# Patient Record
Sex: Male | Born: 1995 | Race: White | Hispanic: No | Marital: Single | Smoking: Current every day smoker
Health system: Southern US, Community
[De-identification: ages and names within clinical notes are randomized; demographics above are authoritative.]

## PROBLEM LIST (undated history)

## (undated) DIAGNOSIS — J45909 Unspecified asthma, uncomplicated: Secondary | ICD-10-CM

## (undated) DIAGNOSIS — A6920 Lyme disease, unspecified: Secondary | ICD-10-CM

## (undated) HISTORY — PX: ELBOW SURGERY: SHX618

---

## 2016-05-04 ENCOUNTER — Encounter (HOSPITAL_COMMUNITY): Payer: Self-pay

## 2016-05-04 ENCOUNTER — Emergency Department (HOSPITAL_COMMUNITY)
Admission: EM | Admit: 2016-05-04 | Discharge: 2016-05-04 | Disposition: A | Discharge status: Home / Self Care | Payer: PRIVATE HEALTH INSURANCE | Attending: Emergency Medicine | Admitting: Emergency Medicine

## 2016-05-04 ENCOUNTER — Emergency Department (HOSPITAL_COMMUNITY): Payer: PRIVATE HEALTH INSURANCE

## 2016-05-04 DIAGNOSIS — S61411A Laceration without foreign body of right hand, initial encounter: Secondary | ICD-10-CM | POA: Insufficient documentation

## 2016-05-04 DIAGNOSIS — W25XXXA Contact with sharp glass, initial encounter: Secondary | ICD-10-CM | POA: Diagnosis not present

## 2016-05-04 DIAGNOSIS — Y999 Unspecified external cause status: Secondary | ICD-10-CM | POA: Insufficient documentation

## 2016-05-04 DIAGNOSIS — Z79899 Other long term (current) drug therapy: Secondary | ICD-10-CM | POA: Insufficient documentation

## 2016-05-04 DIAGNOSIS — Y939 Activity, unspecified: Secondary | ICD-10-CM | POA: Diagnosis not present

## 2016-05-04 DIAGNOSIS — S61211A Laceration without foreign body of left index finger without damage to nail, initial encounter: Secondary | ICD-10-CM | POA: Insufficient documentation

## 2016-05-04 DIAGNOSIS — Y929 Unspecified place or not applicable: Secondary | ICD-10-CM | POA: Diagnosis not present

## 2016-05-04 DIAGNOSIS — J45909 Unspecified asthma, uncomplicated: Secondary | ICD-10-CM | POA: Diagnosis not present

## 2016-05-04 DIAGNOSIS — F17210 Nicotine dependence, cigarettes, uncomplicated: Secondary | ICD-10-CM | POA: Diagnosis not present

## 2016-05-04 HISTORY — DX: Unspecified asthma, uncomplicated: J45.909

## 2016-05-04 HISTORY — DX: Lyme disease, unspecified: A69.20

## 2016-05-04 MED ORDER — LIDOCAINE HCL 2 % IJ SOLN
10.0000 mL | Freq: Once | INTRAMUSCULAR | Status: AC
  Administered 2016-05-04: 200 mg via INTRADERMAL
  Filled 2016-05-04: qty 20

## 2016-05-04 NOTE — ED Notes (Signed)
Bed: WTR5 Expected date:  Expected time:  Means of arrival:  Comments: 

## 2016-05-04 NOTE — ED Notes (Signed)
PA at bedside.

## 2016-05-04 NOTE — ED Provider Notes (Signed)
[facility]-EMERGENCY DEPT Provider Note   CSN: [id number] Arrival date & time: 05/04/16  [id number]     History   Chief Complaint Chief Complaint  Patient presents with  . Hand Lacerations    HPI Micheal Humphrey is a 21 y.o. male.  HPI Micheal Humphrey is a 21 y.o. male presents to emergency department complaining bilateral hand injury. Patient states that he punched through a glass window and sustained lacerations to both hands. He reports bleeding that was stopped with pressure. Reports some numbness distal to laceration on the left hand, denies any weakness or difficulty moving his fingers. Tetanus is up-to-date. Denies any other injuries. Admits to marijuana and alcohol last night.   Past Medical History:  Diagnosis Date  . Asthma   . Lyme disease     There are no active problems to display for this patient.   Past Surgical History:  Procedure Laterality Date  . ELBOW SURGERY         Home Medications    Prior to Admission medications   Medication Sig Start Date End Date Taking? Authorizing Provider  acetaminophen (TYLENOL) 500 MG tablet Take 1,000 mg by mouth every 6 (six) hours as needed for mild pain, moderate pain, fever or headache.   Yes Historical Provider, MD  albuterol (PROVENTIL HFA;VENTOLIN HFA) 108 (90 Base) MCG/ACT inhaler Inhale 1-2 puffs into the lungs every 6 (six) hours as needed for wheezing or shortness of breath.   Yes Historical Provider, MD  levocetirizine (XYZAL) 5 MG tablet Take 5 mg by mouth daily.   Yes Historical Provider, MD  montelukast (SINGULAIR) 10 MG tablet Take 10 mg by mouth daily.   Yes Historical Provider, MD    Family History History reviewed. No pertinent family history.  Social History Social History  Substance Use Topics  . Smoking status: Current Every Day Smoker    Types: Cigarettes, E-cigarettes  . Smokeless tobacco: Never Used  . Alcohol use Yes     Allergies   Amoxicillin; Biaxin [clarithromycin]; Ceftin [cefuroxime];  Penicillins; Septra [sulfamethoxazole-trimethoprim]; and Doxycycline   Review of Systems Review of Systems  Constitutional: Negative for chills and fever.  Respiratory: Negative for cough, chest tightness and shortness of breath.   Cardiovascular: Negative for chest pain, palpitations and leg swelling.  Musculoskeletal: Positive for arthralgias. Negative for myalgias, neck pain and neck stiffness.  Skin: Positive for wound. Negative for rash.  Allergic/Immunologic: Negative for immunocompromised state.  Neurological: Negative for dizziness, weakness, light-headedness, numbness and headaches.  All other systems reviewed and are negative.    Physical Exam Updated Vital Signs BP 120/78 (BP Location: Right Arm)   Temp 98 F (36.7 C)   Resp 16   Ht 5\' 10"  (1.778 m)   Wt 68 kg   SpO2 98%   BMI 21.52 kg/m   Physical Exam  Constitutional: He appears well-developed and well-nourished. No distress.  Eyes: Conjunctivae are normal.  Neck: Neck supple.  Cardiovascular: Normal rate.   Pulmonary/Chest: No respiratory distress.  Abdominal: He exhibits no distension.  Musculoskeletal:  3 cm laceration to the dorsal aspect of the right hand, over second MCP knuckle. Full range of motion of the index finger, good strength with finger extension and flexion at each joint. Sensation intact distally. 1 cm flap laceration over the PIP joint of the left index finger on the dorsal surface. Full range of motion of the finger at PIP, DIP, MCP joints. Strength intact with flexion and extension at each joint. Decreased sensation to palpation over  dorsal distal left index finger. Cap refill <2 sec distally  Skin: Skin is warm and dry.  Nursing note and vitals reviewed.    ED Treatments / Results  Labs (all labs ordered are listed, but only abnormal results are displayed) Labs Reviewed - No data to display  EKG  EKG Interpretation None       Radiology No results  found.  Procedures Procedures (including critical care time) LACERATION REPAIR Performed by: Farris Has Markusic PA-S Authorized by: Jaynie Crumble A Consent: Verbal consent obtained. Risks and benefits: risks, benefits and alternatives were discussed Consent given by: patient Patient identity confirmed: provided demographic data Prepped and Draped in normal sterile fashion Wound explored  Laceration Location: righthand  Laceration Length: 3cm  No Foreign Bodies seen or palpated  Anesthesia: local infiltration  Local anesthetic: lidocaine 2% wo epinephrine  Anesthetic total: 2 ml  Irrigation method: syringe Amount of cleaning: standard  Skin closure: prolene 4.0  Number of sutures: 3  Technique: simple interrupted  Patient tolerance: Patient tolerated the procedure well with no immediate complications.  LACERATION REPAIR Performed by: Farris Has Markusic PA-S Authorized by: Jaynie Crumble A Consent: Verbal consent obtained. Risks and benefits: risks, benefits and alternatives were discussed Consent given by: patient Patient identity confirmed: provided demographic data Prepped and Draped in normal sterile fashion Wound explored  Laceration Location: left index finger  Laceration Length: 1cm  No Foreign Bodies seen or palpated  Anesthesia: local infiltration  Local anesthetic: lidocaine 2% wo epinephrine  Anesthetic total: 1 ml  Irrigation method: syringe Amount of cleaning: standard  Skin closure: prolene 4.0  Number of sutures: 2  Technique: simple interrupted  Patient tolerance: Patient tolerated the procedure well with no immediate complications.   Medications Ordered in ED Medications  lidocaine (XYLOCAINE) 2 % (with pres) injection 200 mg (200 mg Intradermal Given by Other 05/04/16 1042)     Initial Impression / Assessment and Plan / ED Course  I have reviewed the triage vital signs  and the nursing notes.  Pertinent labs & imaging results that were available during my care of the patient were reviewed by me and considered in my medical decision making (see chart for details).     Pt in ED with with lacerations to bilateral hands. Repaired with sutures. No foreign body palpated or seen on xray. No evidence of tendon injury. There may be some nerve involvement to the left index finger but pt having normal function and strength to the finger. Plan to dc home with wound care, suture removal in 7-10 days, follow up as needed.   Vitals:   05/04/16 0850 05/04/16 0858  BP: 120/78   Resp: 16   Temp: 98 F (36.7 C)   SpO2: 98%   Weight:  68 kg  Height:  5\' 10"  (1.778 m)     Final Clinical Impressions(s) / ED Diagnoses   Final diagnoses:  Laceration of right hand without foreign body, initial encounter  Laceration of left index finger without foreign body without damage to nail, initial encounter    New Prescriptions Discharge Medication List as of 05/04/2016 12:11 PM       Jaynie Crumble, PA-C 05/04/16 1515    Gerhard Munch, MD 05/04/16 [phone]

## 2016-05-04 NOTE — ED Notes (Signed)
Pt continues to talk on his phone.

## 2016-05-04 NOTE — ED Notes (Signed)
PT DISCHARGED. INSTRUCTIONS GIVEN TO [employer]. AAOX4. PT IN NO APPARENT DISTRESS. THE OPPORTUNITY TO ASK QUESTIONS WAS PROVIDED.

## 2016-05-04 NOTE — ED Notes (Signed)
When this RN went to additionally clean hands, Pt was more concerned with using his phone.  Will attempt again shortly.

## 2016-05-04 NOTE — ED Notes (Signed)
This Clinical research associatewriter assessed for obvious retained pieces of glass.  None were noted.  Pt asked to soak hands in provided basins containing sterile water.

## 2016-05-04 NOTE — Discharge Instructions (Signed)
Bacitracin 2-3 times a day. Keep laceration clean and dry. Follow up with your family doctor for suture removal in 7-10 days. Follow up sooner if any signs of infection.

## 2016-05-04 NOTE — ED Triage Notes (Signed)
Pt presents w/ [facility] after breaking out a window w/ his hands.  Multiple lacerations noted to both hands.  Pain score 10/10.

## 2017-08-02 IMAGING — CR DG FINGER INDEX 2+V*L*
3 series · 3 of 3 positions shown · non-contrast
Comparison: None.

CLINICAL DATA: Laceration through glass window

EXAM:
LEFT SECOND FINGER 2+V

[x finger pa left]
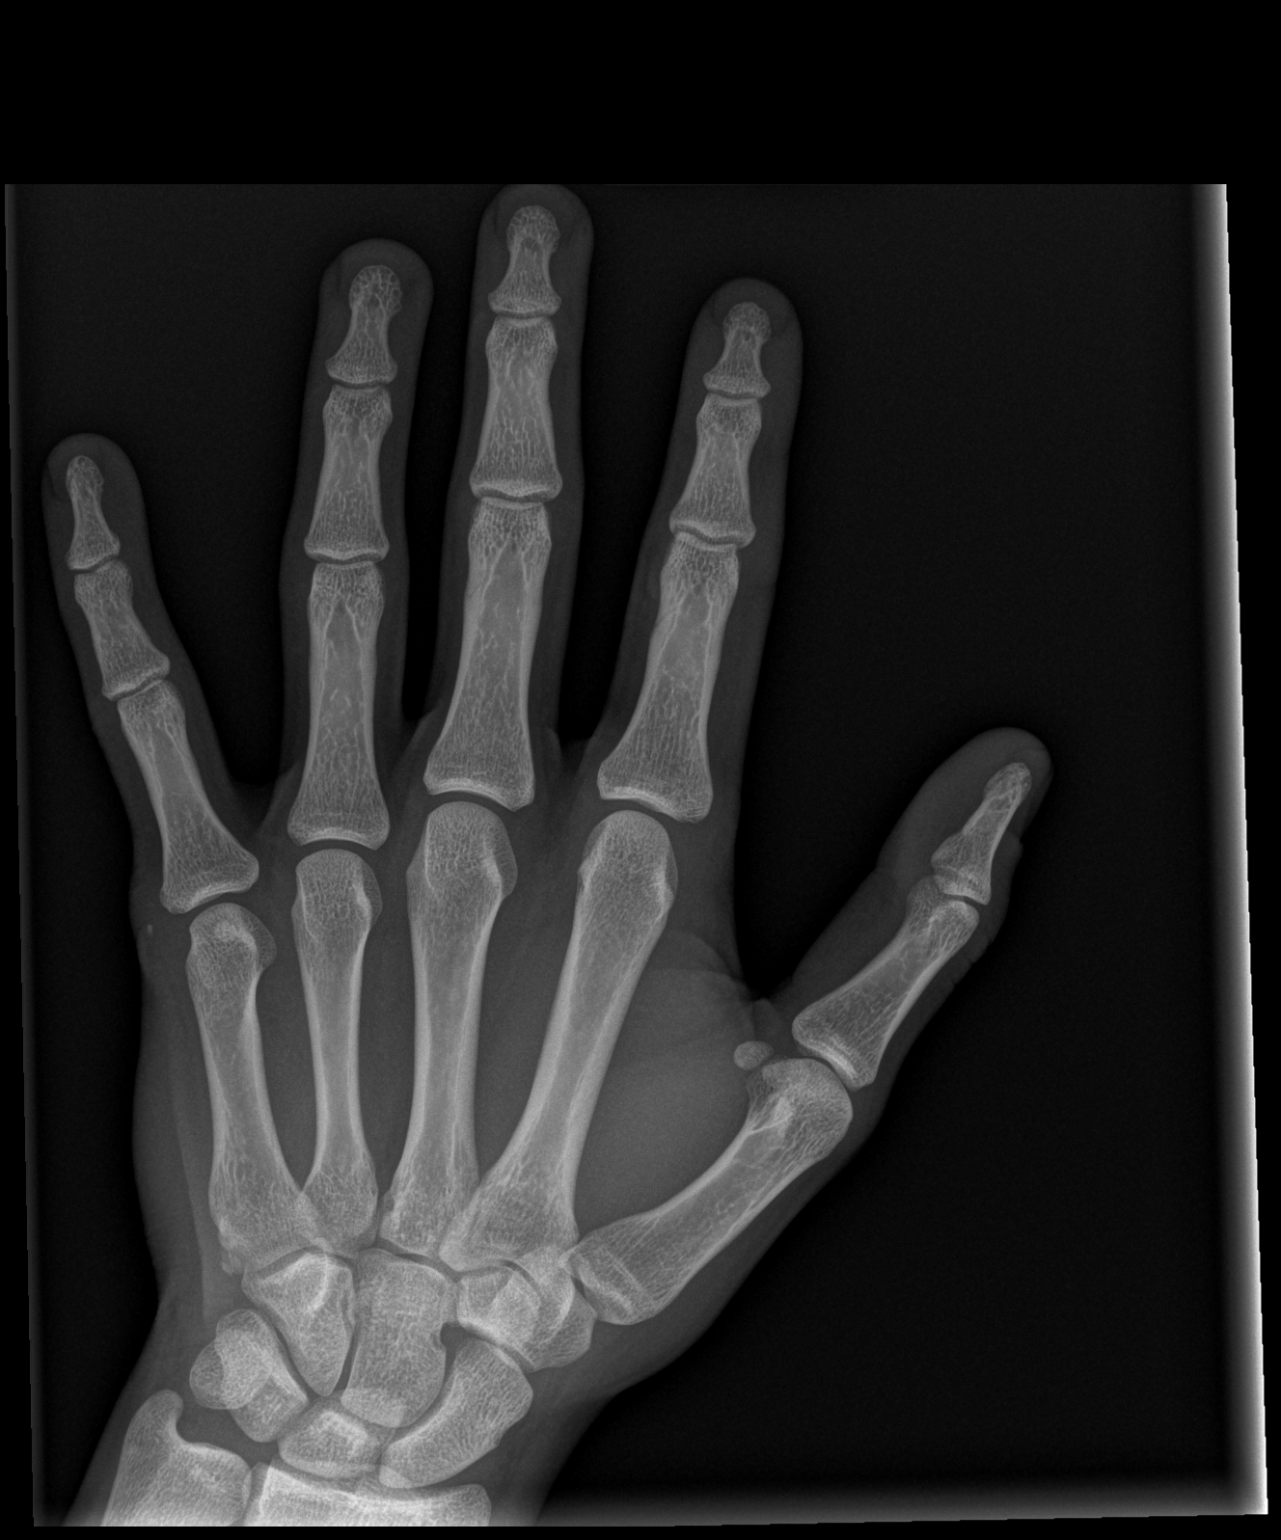

[x finger obl left]
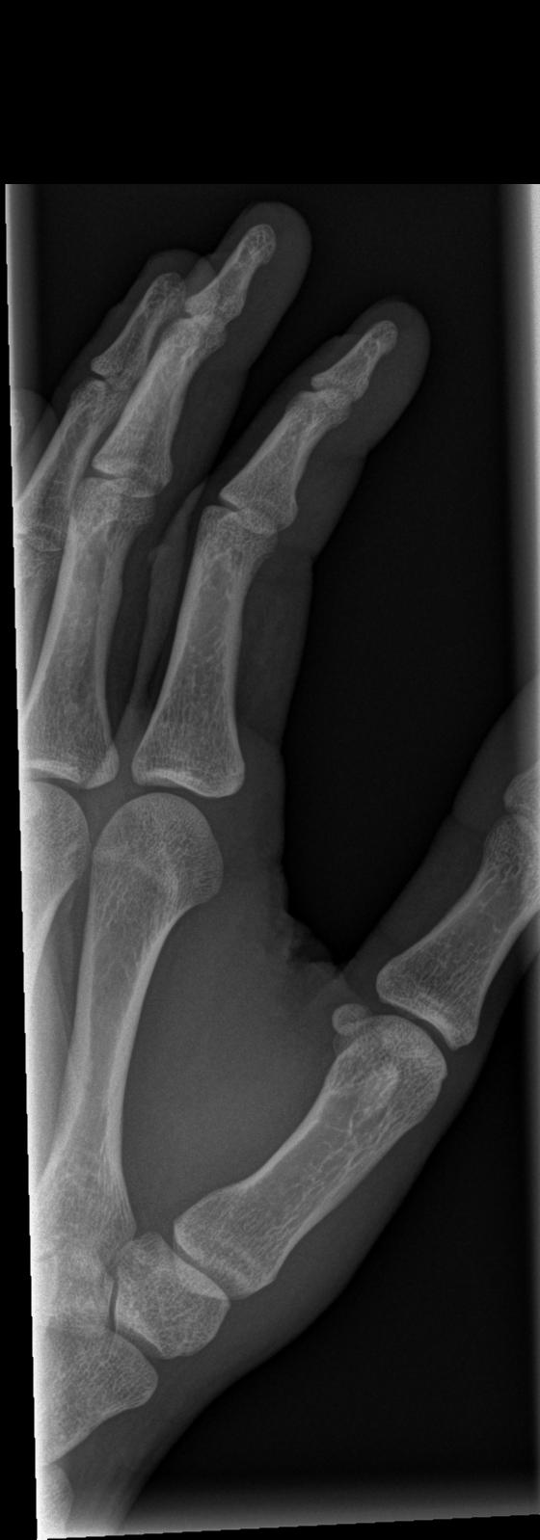

[x finger lat left]
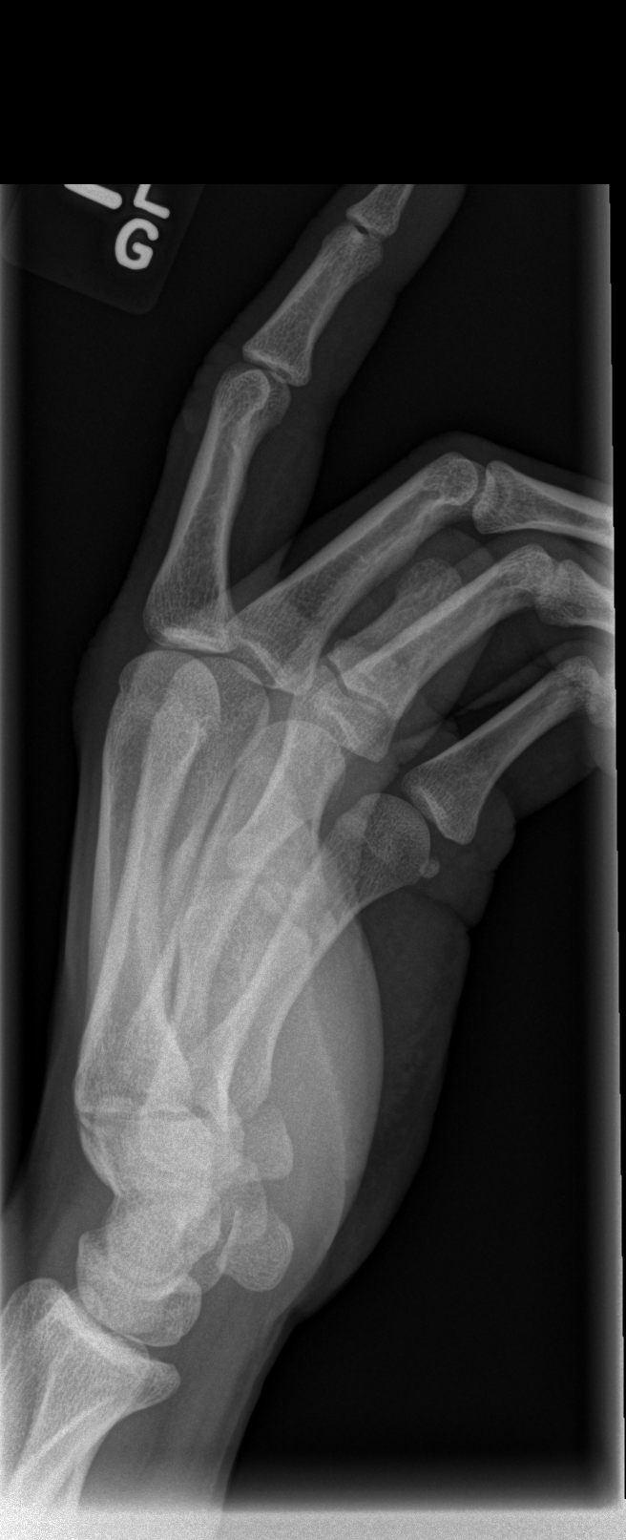

[3 of 3 positions shown; findings below may reference images not displayed]

FINDINGS: Frontal, oblique, and lateral views were obtained. No radiopaque
foreign body. No soft tissue air. No fracture or dislocation. Joint
spaces appear normal. No erosive change.
IMPRESSION: No radiopaque foreign body or soft tissue air. No apparent
arthropathy. No fracture or dislocation.

## 2017-08-02 IMAGING — CR DG HAND COMPLETE 3+V*R*
3 series · 3 of 3 positions shown · non-contrast
Comparison: None.

CLINICAL DATA: Hand caught in glass window with laceration

EXAM:
RIGHT HAND - COMPLETE 3+ VIEW

[x hand pa right]
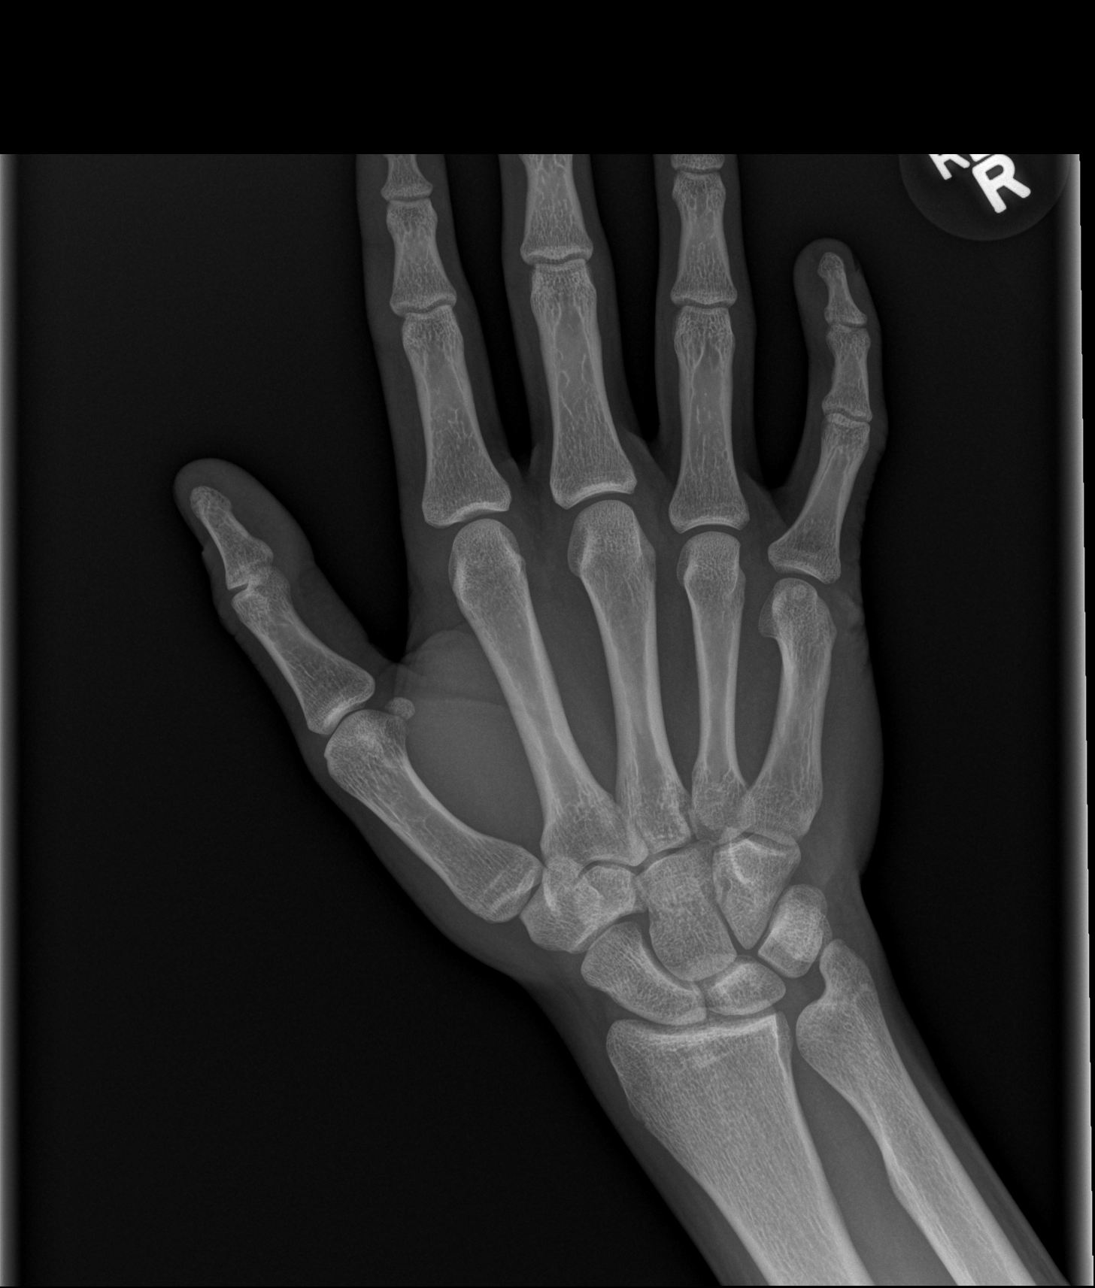

[x hand obl right]
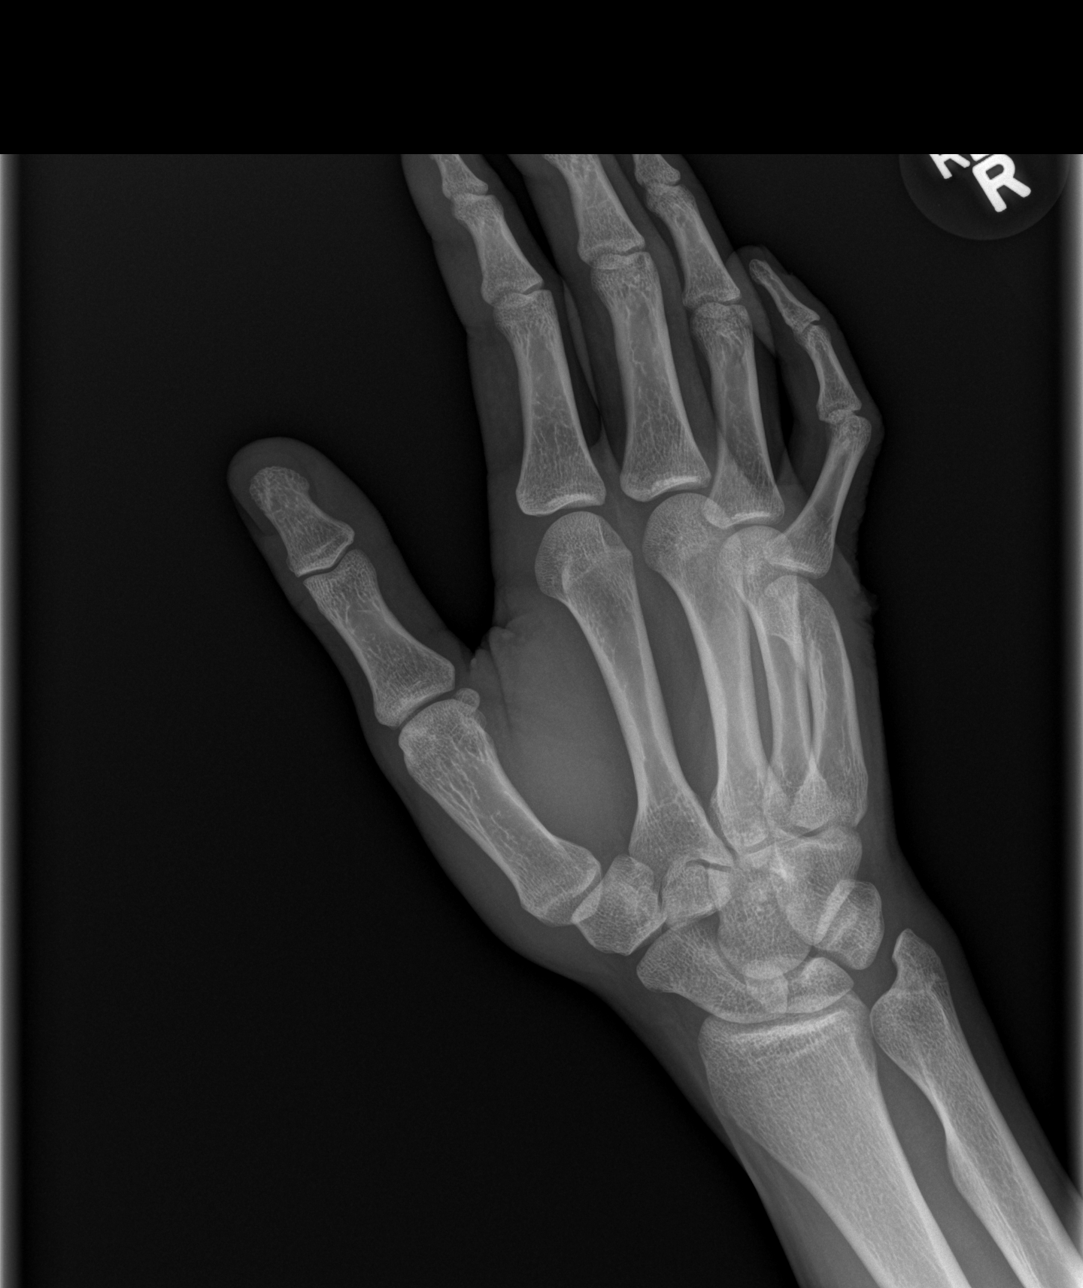

[x hand lat right]
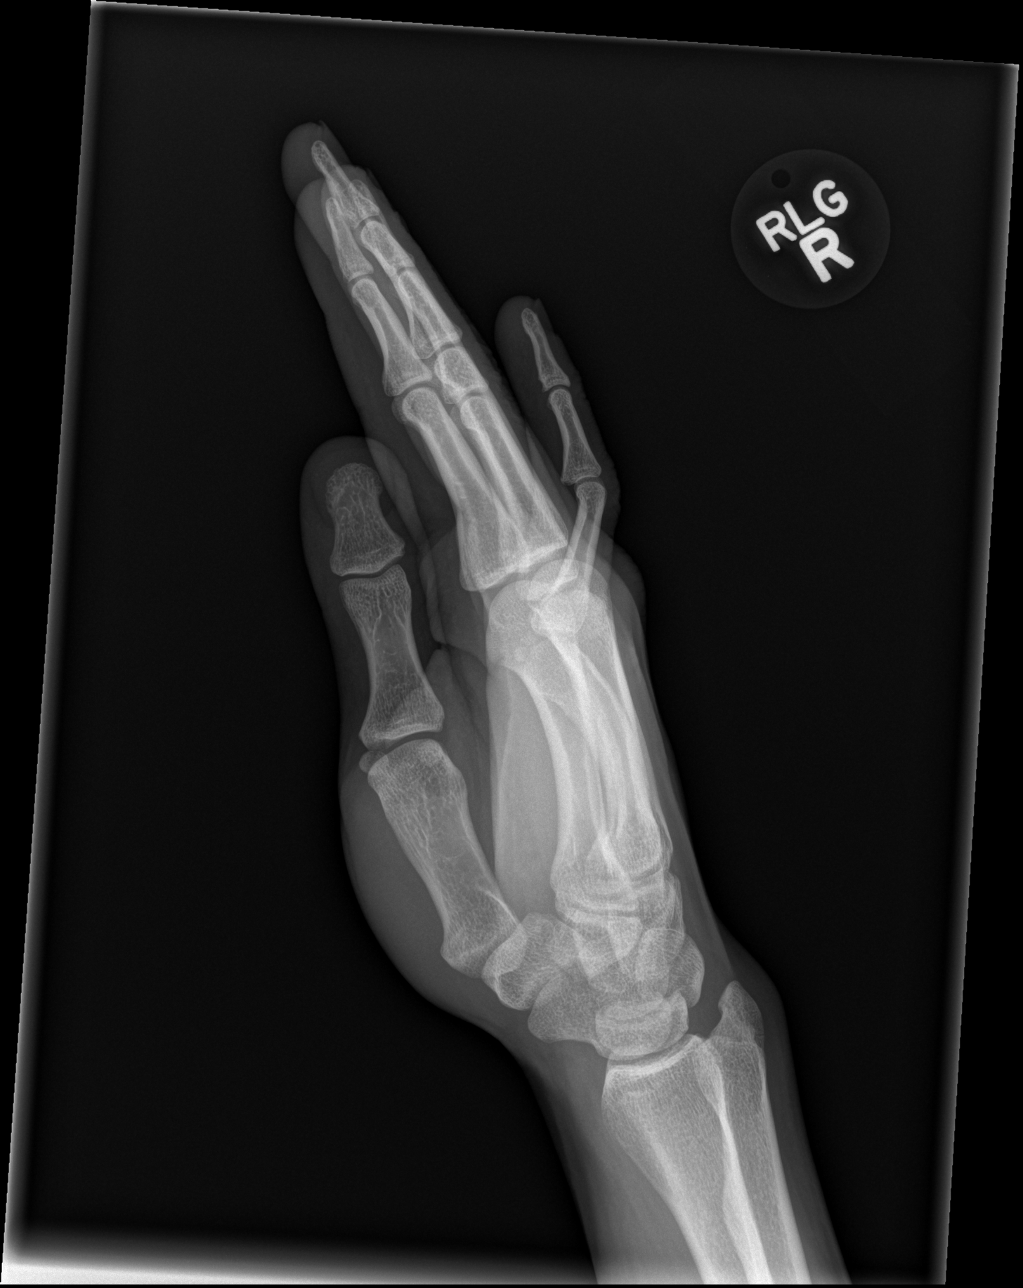

[3 of 3 positions shown; findings below may reference images not displayed]

FINDINGS: Frontal, oblique, and lateral views were obtained. There is soft
tissue injury medial to the distal fifth metacarpal. No radiopaque
foreign body. There is evidence of old trauma involving the fifth
metacarpal with remodeling in this area. No acute fracture or
dislocation. Joint spaces appear normal. No erosive change.
IMPRESSION: Soft tissue injury medial to the distal fifth metacarpal without
radiopaque foreign body. Old fracture fifth metacarpal with
remodeling. No acute fracture or dislocation. No apparent
arthropathy.

## 2019-12-16 DEATH — deceased
